# Patient Record
Sex: Female | Born: 2000 | Race: White | Hispanic: No | Marital: Single | Smoking: Current some day smoker
Health system: Southern US, Community
[De-identification: ages and names within clinical notes are randomized; demographics above are authoritative.]

---
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
## 2020-04-08 ENCOUNTER — Other Ambulatory Visit: Payer: Self-pay

## 2020-04-08 ENCOUNTER — Encounter (HOSPITAL_COMMUNITY): Payer: Self-pay

## 2020-04-08 ENCOUNTER — Emergency Department (HOSPITAL_COMMUNITY)
Admission: EM | Admit: 2020-04-08 | Discharge: 2020-04-08 | Disposition: A | Discharge status: Home / Self Care | Payer: BC Managed Care – PPO | Attending: Emergency Medicine | Admitting: Emergency Medicine

## 2020-04-08 DIAGNOSIS — M545 Low back pain, unspecified: Secondary | ICD-10-CM

## 2020-04-08 DIAGNOSIS — F17200 Nicotine dependence, unspecified, uncomplicated: Secondary | ICD-10-CM | POA: Diagnosis not present

## 2020-04-08 MED ORDER — METHOCARBAMOL 500 MG PO TABS: 500.0000 mg | ORAL_TABLET | Freq: Two times a day (BID) | ORAL | 0 refills | Status: AC
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
## 2020-04-08 NOTE — Discharge Instructions (Signed)

## 2020-09-27 DIAGNOSIS — O99331 Smoking (tobacco) complicating pregnancy, first trimester: Secondary | ICD-10-CM | POA: Diagnosis not present

## 2020-09-27 DIAGNOSIS — Z3A11 11 weeks gestation of pregnancy: Secondary | ICD-10-CM | POA: Diagnosis not present

## 2020-09-27 DIAGNOSIS — F17210 Nicotine dependence, cigarettes, uncomplicated: Secondary | ICD-10-CM | POA: Diagnosis not present

## 2020-09-27 DIAGNOSIS — O99611 Diseases of the digestive system complicating pregnancy, first trimester: Secondary | ICD-10-CM | POA: Diagnosis not present

## 2020-09-27 DIAGNOSIS — O09891 Supervision of other high risk pregnancies, first trimester: Secondary | ICD-10-CM | POA: Diagnosis not present

## 2020-09-27 DIAGNOSIS — K047 Periapical abscess without sinus: Secondary | ICD-10-CM | POA: Diagnosis not present

## 2020-11-22 DIAGNOSIS — Z3A17 17 weeks gestation of pregnancy: Secondary | ICD-10-CM | POA: Diagnosis not present

## 2020-11-22 DIAGNOSIS — Z363 Encounter for antenatal screening for malformations: Secondary | ICD-10-CM | POA: Diagnosis not present

## 2020-11-22 DIAGNOSIS — Z36 Encounter for antenatal screening for chromosomal anomalies: Secondary | ICD-10-CM | POA: Diagnosis not present

## 2020-11-22 DIAGNOSIS — O09892 Supervision of other high risk pregnancies, second trimester: Secondary | ICD-10-CM | POA: Diagnosis not present

## 2020-11-22 DIAGNOSIS — O99332 Smoking (tobacco) complicating pregnancy, second trimester: Secondary | ICD-10-CM | POA: Diagnosis not present

## 2021-03-14 DIAGNOSIS — O26843 Uterine size-date discrepancy, third trimester: Secondary | ICD-10-CM | POA: Diagnosis not present

## 2021-03-14 DIAGNOSIS — Z2821 Immunization not carried out because of patient refusal: Secondary | ICD-10-CM | POA: Diagnosis not present

## 2021-03-14 DIAGNOSIS — Z28310 Unvaccinated for covid-19: Secondary | ICD-10-CM | POA: Diagnosis not present

## 2021-03-14 DIAGNOSIS — Z3A33 33 weeks gestation of pregnancy: Secondary | ICD-10-CM | POA: Diagnosis not present

## 2021-03-14 DIAGNOSIS — F17210 Nicotine dependence, cigarettes, uncomplicated: Secondary | ICD-10-CM | POA: Diagnosis not present

## 2021-03-14 DIAGNOSIS — O99333 Smoking (tobacco) complicating pregnancy, third trimester: Secondary | ICD-10-CM | POA: Diagnosis not present

## 2021-03-26 DIAGNOSIS — Z3A34 34 weeks gestation of pregnancy: Secondary | ICD-10-CM | POA: Diagnosis not present

## 2021-03-26 DIAGNOSIS — O09893 Supervision of other high risk pregnancies, third trimester: Secondary | ICD-10-CM | POA: Diagnosis not present

## 2021-03-26 DIAGNOSIS — F17200 Nicotine dependence, unspecified, uncomplicated: Secondary | ICD-10-CM | POA: Diagnosis not present

## 2021-03-26 DIAGNOSIS — Z3689 Encounter for other specified antenatal screening: Secondary | ICD-10-CM | POA: Diagnosis not present

## 2021-03-26 DIAGNOSIS — O99333 Smoking (tobacco) complicating pregnancy, third trimester: Secondary | ICD-10-CM | POA: Diagnosis not present

## 2021-05-01 DIAGNOSIS — O480 Post-term pregnancy: Secondary | ICD-10-CM | POA: Diagnosis not present

## 2021-05-01 DIAGNOSIS — O471 False labor at or after 37 completed weeks of gestation: Secondary | ICD-10-CM | POA: Diagnosis not present

## 2021-05-01 DIAGNOSIS — Z2821 Immunization not carried out because of patient refusal: Secondary | ICD-10-CM | POA: Diagnosis not present

## 2021-05-01 DIAGNOSIS — Z28310 Unvaccinated for covid-19: Secondary | ICD-10-CM | POA: Diagnosis not present

## 2021-05-01 DIAGNOSIS — Z3A40 40 weeks gestation of pregnancy: Secondary | ICD-10-CM | POA: Diagnosis not present

## 2021-05-01 DIAGNOSIS — F17210 Nicotine dependence, cigarettes, uncomplicated: Secondary | ICD-10-CM | POA: Diagnosis not present

## 2021-05-01 DIAGNOSIS — O99333 Smoking (tobacco) complicating pregnancy, third trimester: Secondary | ICD-10-CM | POA: Diagnosis not present

## 2021-05-08 DIAGNOSIS — Z3A41 41 weeks gestation of pregnancy: Secondary | ICD-10-CM | POA: Diagnosis not present

## 2021-05-08 DIAGNOSIS — O0930 Supervision of pregnancy with insufficient antenatal care, unspecified trimester: Secondary | ICD-10-CM | POA: Diagnosis not present

## 2021-05-08 DIAGNOSIS — O480 Post-term pregnancy: Secondary | ICD-10-CM | POA: Diagnosis not present

## 2021-11-02 DIAGNOSIS — S8002XA Contusion of left knee, initial encounter: Secondary | ICD-10-CM | POA: Diagnosis not present

## 2021-11-02 DIAGNOSIS — Y939 Activity, unspecified: Secondary | ICD-10-CM | POA: Diagnosis not present

## 2021-11-02 DIAGNOSIS — W03XXXA Other fall on same level due to collision with another person, initial encounter: Secondary | ICD-10-CM | POA: Diagnosis not present

## 2021-11-02 DIAGNOSIS — S8392XA Sprain of unspecified site of left knee, initial encounter: Secondary | ICD-10-CM | POA: Diagnosis not present

## 2021-11-02 DIAGNOSIS — M25562 Pain in left knee: Secondary | ICD-10-CM | POA: Diagnosis not present

## 2021-11-02 DIAGNOSIS — Y929 Unspecified place or not applicable: Secondary | ICD-10-CM | POA: Diagnosis not present

## 2022-07-04 DIAGNOSIS — Z3A13 13 weeks gestation of pregnancy: Secondary | ICD-10-CM | POA: Diagnosis not present

## 2022-07-04 DIAGNOSIS — Z3481 Encounter for supervision of other normal pregnancy, first trimester: Secondary | ICD-10-CM | POA: Diagnosis not present

## 2022-07-12 DIAGNOSIS — Z3687 Encounter for antenatal screening for uncertain dates: Secondary | ICD-10-CM | POA: Diagnosis not present

## 2022-07-12 DIAGNOSIS — Z3A01 Less than 8 weeks gestation of pregnancy: Secondary | ICD-10-CM | POA: Diagnosis not present

## 2022-07-12 DIAGNOSIS — F17200 Nicotine dependence, unspecified, uncomplicated: Secondary | ICD-10-CM | POA: Diagnosis not present

## 2022-07-12 DIAGNOSIS — O99331 Smoking (tobacco) complicating pregnancy, first trimester: Secondary | ICD-10-CM | POA: Diagnosis not present

## 2022-08-20 DIAGNOSIS — Z62811 Personal history of psychological abuse in childhood: Secondary | ICD-10-CM | POA: Diagnosis not present

## 2022-08-20 DIAGNOSIS — O99891 Other specified diseases and conditions complicating pregnancy: Secondary | ICD-10-CM | POA: Diagnosis not present

## 2022-08-20 DIAGNOSIS — R42 Dizziness and giddiness: Secondary | ICD-10-CM | POA: Diagnosis not present

## 2022-08-20 DIAGNOSIS — O99331 Smoking (tobacco) complicating pregnancy, first trimester: Secondary | ICD-10-CM | POA: Diagnosis not present

## 2022-08-20 DIAGNOSIS — Z3A12 12 weeks gestation of pregnancy: Secondary | ICD-10-CM | POA: Diagnosis not present

## 2022-08-20 DIAGNOSIS — F17210 Nicotine dependence, cigarettes, uncomplicated: Secondary | ICD-10-CM | POA: Diagnosis not present

## 2022-08-20 DIAGNOSIS — Z8759 Personal history of other complications of pregnancy, childbirth and the puerperium: Secondary | ICD-10-CM | POA: Diagnosis not present

## 2022-08-20 DIAGNOSIS — Z8279 Family history of other congenital malformations, deformations and chromosomal abnormalities: Secondary | ICD-10-CM | POA: Diagnosis not present

## 2022-10-09 DIAGNOSIS — O99332 Smoking (tobacco) complicating pregnancy, second trimester: Secondary | ICD-10-CM | POA: Diagnosis not present

## 2022-10-09 DIAGNOSIS — Z3A19 19 weeks gestation of pregnancy: Secondary | ICD-10-CM | POA: Diagnosis not present

## 2022-10-09 DIAGNOSIS — Z3689 Encounter for other specified antenatal screening: Secondary | ICD-10-CM | POA: Diagnosis not present

## 2022-11-21 DIAGNOSIS — Z3A25 25 weeks gestation of pregnancy: Secondary | ICD-10-CM | POA: Diagnosis not present

## 2022-11-21 DIAGNOSIS — O99013 Anemia complicating pregnancy, third trimester: Secondary | ICD-10-CM | POA: Diagnosis not present

## 2022-11-21 DIAGNOSIS — O99891 Other specified diseases and conditions complicating pregnancy: Secondary | ICD-10-CM | POA: Diagnosis not present

## 2022-11-21 DIAGNOSIS — Z3A39 39 weeks gestation of pregnancy: Secondary | ICD-10-CM | POA: Diagnosis not present

## 2022-11-21 DIAGNOSIS — O99332 Smoking (tobacco) complicating pregnancy, second trimester: Secondary | ICD-10-CM | POA: Diagnosis not present

## 2022-11-21 DIAGNOSIS — Z8659 Personal history of other mental and behavioral disorders: Secondary | ICD-10-CM | POA: Diagnosis not present

## 2022-11-21 DIAGNOSIS — O3662X0 Maternal care for excessive fetal growth, second trimester, not applicable or unspecified: Secondary | ICD-10-CM | POA: Diagnosis not present

## 2023-01-17 DIAGNOSIS — B379 Candidiasis, unspecified: Secondary | ICD-10-CM | POA: Diagnosis not present

## 2023-01-17 DIAGNOSIS — O99013 Anemia complicating pregnancy, third trimester: Secondary | ICD-10-CM | POA: Diagnosis not present

## 2023-01-17 DIAGNOSIS — O98913 Unspecified maternal infectious and parasitic disease complicating pregnancy, third trimester: Secondary | ICD-10-CM | POA: Diagnosis not present

## 2023-01-17 DIAGNOSIS — Z0371 Encounter for suspected problem with amniotic cavity and membrane ruled out: Secondary | ICD-10-CM | POA: Diagnosis not present

## 2023-01-17 DIAGNOSIS — B3731 Acute candidiasis of vulva and vagina: Secondary | ICD-10-CM | POA: Diagnosis not present

## 2023-01-17 DIAGNOSIS — O26893 Other specified pregnancy related conditions, third trimester: Secondary | ICD-10-CM | POA: Diagnosis not present

## 2023-01-17 DIAGNOSIS — Z3A33 33 weeks gestation of pregnancy: Secondary | ICD-10-CM | POA: Diagnosis not present

## 2023-01-17 DIAGNOSIS — O98813 Other maternal infectious and parasitic diseases complicating pregnancy, third trimester: Secondary | ICD-10-CM | POA: Diagnosis not present

## 2023-03-01 DIAGNOSIS — O4202 Full-term premature rupture of membranes, onset of labor within 24 hours of rupture: Secondary | ICD-10-CM | POA: Diagnosis not present

## 2023-03-01 DIAGNOSIS — Z3A39 39 weeks gestation of pregnancy: Secondary | ICD-10-CM | POA: Diagnosis not present

## 2023-03-01 DIAGNOSIS — Z302 Encounter for sterilization: Secondary | ICD-10-CM | POA: Diagnosis not present

## 2023-03-01 DIAGNOSIS — D649 Anemia, unspecified: Secondary | ICD-10-CM | POA: Diagnosis not present

## 2023-03-01 DIAGNOSIS — Z720 Tobacco use: Secondary | ICD-10-CM | POA: Diagnosis not present

## 2023-03-01 DIAGNOSIS — O9902 Anemia complicating childbirth: Secondary | ICD-10-CM | POA: Diagnosis not present

## 2023-03-02 DIAGNOSIS — R109 Unspecified abdominal pain: Secondary | ICD-10-CM | POA: Diagnosis not present

## 2023-03-02 DIAGNOSIS — D649 Anemia, unspecified: Secondary | ICD-10-CM | POA: Diagnosis not present

## 2023-03-02 DIAGNOSIS — M25511 Pain in right shoulder: Secondary | ICD-10-CM | POA: Diagnosis not present

## 2023-03-02 DIAGNOSIS — O9089 Other complications of the puerperium, not elsewhere classified: Secondary | ICD-10-CM | POA: Diagnosis not present

## 2023-03-03 DIAGNOSIS — O9089 Other complications of the puerperium, not elsewhere classified: Secondary | ICD-10-CM | POA: Diagnosis not present

## 2023-03-03 DIAGNOSIS — R109 Unspecified abdominal pain: Secondary | ICD-10-CM | POA: Diagnosis not present

## 2023-03-03 DIAGNOSIS — D649 Anemia, unspecified: Secondary | ICD-10-CM | POA: Diagnosis not present

## 2023-03-03 DIAGNOSIS — M25511 Pain in right shoulder: Secondary | ICD-10-CM | POA: Diagnosis not present

## 2023-04-14 DIAGNOSIS — Z30430 Encounter for insertion of intrauterine contraceptive device: Secondary | ICD-10-CM | POA: Diagnosis not present

## 2023-06-11 DIAGNOSIS — Z30431 Encounter for routine checking of intrauterine contraceptive device: Secondary | ICD-10-CM | POA: Diagnosis not present

## 2023-06-11 DIAGNOSIS — Z124 Encounter for screening for malignant neoplasm of cervix: Secondary | ICD-10-CM | POA: Diagnosis not present

## 2023-11-27 DIAGNOSIS — F322 Major depressive disorder, single episode, severe without psychotic features: Secondary | ICD-10-CM | POA: Diagnosis not present

## 2023-11-27 DIAGNOSIS — Z1330 Encounter for screening examination for mental health and behavioral disorders, unspecified: Secondary | ICD-10-CM | POA: Diagnosis not present

## 2023-11-27 DIAGNOSIS — R202 Paresthesia of skin: Secondary | ICD-10-CM | POA: Diagnosis not present

## 2023-11-27 DIAGNOSIS — R200 Anesthesia of skin: Secondary | ICD-10-CM | POA: Diagnosis not present
